# Patient Record
Sex: Female | Born: 2000 | Race: Asian | Hispanic: No | Marital: Single | Smoking: Never smoker
Health system: Southern US, Community
[De-identification: ages and names within clinical notes are randomized; demographics above are authoritative.]

## PROBLEM LIST (undated history)

## (undated) HISTORY — PX: TONSILLECTOMY: SUR1361

---
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
## 2019-02-20 ENCOUNTER — Ambulatory Visit
Admission: EM | Admit: 2019-02-20 | Discharge: 2019-02-20 | Disposition: A | Discharge status: Home / Self Care | Payer: Medicaid Other | Attending: Urgent Care | Admitting: Urgent Care

## 2019-02-20 ENCOUNTER — Other Ambulatory Visit: Payer: Self-pay

## 2019-02-20 DIAGNOSIS — H109 Unspecified conjunctivitis: Secondary | ICD-10-CM | POA: Diagnosis not present

## 2019-02-20 MED ORDER — TETRACAINE HCL 0.5 % OP SOLN: 1.0000 [drp] | Freq: Once | OPHTHALMIC | Status: DC

## 2019-02-20 MED ORDER — POLYMYXIN B-TRIMETHOPRIM 10000-0.1 UNIT/ML-% OP SOLN: 1.0000 [drp] | OPHTHALMIC | 0 refills | Status: DC

## 2019-02-20 MED ORDER — FLUORESCEIN SODIUM 1 MG OP STRP: 1.0000 | ORAL_STRIP | Freq: Once | OPHTHALMIC | Status: DC

## 2019-02-20 NOTE — ED Provider Notes (Signed)
[place], [place]   Name: Julie Simmons DOB: 05/17/2000 MRN: [id number] CSN: [id number] PCP: Patient, No Pcp Per  Arrival date and time:  02/20/19 1517  Chief Complaint:  Conjunctivitis   NOTE: Prior to seeing the patient today, I have reviewed the triage nursing documentation and vital signs. Clinical staff has updated patient's PMH/PSHx, current medication list, and drug allergies/intolerances to ensure comprehensive history available to assist in medical decision making.   History:   HPI: Julie Simmons is a 18 y.o. female who presents today with complaints of pain and redness in her RIGHT eye that began yesterday. She denies any injury to the eye, but notes that she was rubbing her eye and got mascara in it. Patient woke this morning with swelling and excessive tearing. She has experienced mild exudative crusting. Vision is not blurred. Patient notes that eye irritation is not exacerbated by light (photophobia). She does not wear contact lenses. Patient has not tried any over the counter eye drops or pain medications to help reduce/relieve her current symptoms.   Visual Acuity: Right Eye Distance: 20/30 Left Eye Distance: 20/30 Bilateral Distance: 20/30(uncorrected)  History reviewed. No pertinent past medical history.  Past Surgical History:  Procedure Laterality Date  . TONSILLECTOMY      History reviewed. No pertinent family history.  Social History   Tobacco Use  . Smoking status: Never Smoker  . Smokeless tobacco: Never Used  Substance Use Topics  . Alcohol use: Never    Frequency: Never  . Drug use: Never    There are no active problems to display for this patient.   Home Medications:    No outpatient medications have been marked as taking for the 02/20/19 encounter [facility] Encounter).    Allergies:   Patient has no known allergies.  Review of Systems (ROS): Review of Systems  Constitutional: Negative for chills and fever.  Eyes: Positive for pain,  discharge, redness and itching. Negative for photophobia and visual disturbance.  Respiratory: Negative for cough and shortness of breath.   Cardiovascular: Negative for chest pain and palpitations.  All other systems reviewed and are negative.    Vital Signs: Today's Vitals   02/20/19 1531 02/20/19 1532 02/20/19 1548  BP: 119/68    Pulse: 83    Resp: 16    Temp: 98.7 F (37.1 C)    TempSrc: Oral    SpO2: 100%    Weight:  191 lb (86.6 kg)   Height:  4\' 11"  (1.499 m)   PainSc: 0-No pain  0-No pain    Physical Exam: Physical Exam  Constitutional: She is oriented to person, place, and time and well-developed, well-nourished, and in no distress. Vital signs are normal.  HENT:  Head: Normocephalic and atraumatic.  Eyes: Right eye exhibits discharge. Right conjunctiva is injected.  Neck: Normal range of motion. Neck supple.  Cardiovascular: Normal rate and regular rhythm.  Pulmonary/Chest: Effort normal and breath sounds normal.  Musculoskeletal: Normal range of motion.  Neurological: She is alert and oriented to person, place, and time.  Skin: Skin is warm and dry. No rash noted.  Psychiatric: Mood and affect normal.   Wood's lamp examination of the RIGHT eye . Consent: Procedure explained to patient prior to performing. Drug allergies reviewed. Verbal consent obtained.  . Procedural course: Topical anesthetic (tetracaine) gtts instilled into the affected eye. Adequate time allowed for medication to be effective. Fluorescein stain applied. Eye thoroughly examined under black light.  . Findings: Examination revealed no increased fluorescein uptake in  the cornea. . Tolerance: Patient tolerated exam/procedure well  Urgent Care Treatments / Results:   LABS: PLEASE NOTE: all labs that were ordered this encounter are listed, however only abnormal results are displayed. Labs Reviewed - No data to display  EKG: -None  RADIOLOGY: No results found.  PROCEDURES: Procedures   MEDICATIONS RECEIVED THIS VISIT: Medications  tetracaine (PONTOCAINE) 0.5 % ophthalmic solution 1 drop (has no administration in time range)  fluorescein ophthalmic strip 1 strip (has no administration in time range)    PERTINENT CLINICAL COURSE NOTES/UPDATES:   Initial Impression / Assessment and Plan / Urgent Care Course:  Pertinent labs & imaging results that were available during my care of the patient were personally reviewed by me and considered in my medical decision making (see lab/imaging section of note for values and interpretations).  Julie Simmons is a 18 y.o. female who presents to [facility] today with complaints of Conjunctivitis   Patient is well appearing overall in clinic today. She does not appear to be in any acute distress. Presenting symptoms (see HPI) and exam as documented above. Wood's lamp examination revealed no evidence of corneal abrasion. Given appearance of eye, will proceed with treatment for acute bacterial conjunctivitis using a 5 day course of Polytrim ophthalmic gtts. Patient encouraged to avoid touching/rubbing her eye. Recommended cool compresses to help soothe eye. Patient to follow up with eye doctor in the next few days if not improving. May use Tylenol and/or Ibuprofen as needed for discomfort.  I have reviewed the follow up and strict return precautions for any new or worsening symptoms. Patient is aware of symptoms that would be deemed urgent/emergent, and would thus require further evaluation either here or in the emergency department. At the time of discharge, she verbalized understanding and consent with the discharge plan as it was reviewed with her. All questions were fielded by provider and/or clinic staff prior to patient discharge.    Final Clinical Impressions / Urgent Care Diagnoses:   Final diagnoses:  Bacterial conjunctivitis of right eye    New Prescriptions:   Controlled Substance Registry consulted? Not Applicable   Meds ordered this encounter  Medications  . tetracaine (PONTOCAINE) 0.5 % ophthalmic solution 1 drop  . fluorescein ophthalmic strip 1 strip  . trimethoprim-polymyxin b (POLYTRIM) ophthalmic solution    Sig: Place 1 drop into the right eye every 4 (four) hours. X 5 days    Dispense:  10 mL    Refill:  0    Recommended Follow up Care:  Patient encouraged to follow up with the following provider within the specified time frame, or sooner as dictated by the severity of her symptoms. As always, she was instructed that for any urgent/emergent care needs, she should seek care either here or in the emergency department for more immediate evaluation.  Follow-up Information    Your eye doctor In 1 week.   Why: General reassessment of symptoms if not improving. Sooner if your vision changes.        NOTE: This note was prepared using Scientist, clinical (histocompatibility and immunogenetics) along with smaller Lobbyist. Despite my best ability to proofread, there is the potential that transcriptional errors may still occur from this process, and are completely unintentional.    Verlee Monte, NP 02/20/19 1559

## 2019-02-20 NOTE — ED Triage Notes (Signed)
Patient states that on Saturday she rubbed her eye while she had mascara on. Patient states that right eye has been irritated since, redness, watery and sensitive to light.

## 2019-02-20 NOTE — Discharge Instructions (Signed)
It was very nice seeing you today in clinic. Thank you for entrusting me with your care.   Avoid rubbing /touching eye. Use cool compresses to help soothe.   Make arrangements to follow up with your regular eye doctor in the next week if not better, if if your vision worsens. If your symptoms/condition worsens, please seek follow up care either here or in the ER. Please remember, our [facility] providers are "right here with you" when you need [place].   Again, it was my pleasure to take care of you today. Thank you for choosing our clinic. I hope that you start to feel better quickly.   Honor Loh, MSN, APRN, FNP-C, CEN Advanced Practice Provider [facility]

## 2019-02-24 ENCOUNTER — Ambulatory Visit (INDEPENDENT_AMBULATORY_CARE_PROVIDER_SITE_OTHER): Payer: Medicaid Other | Admitting: Family Medicine

## 2019-02-24 ENCOUNTER — Encounter: Payer: Self-pay | Admitting: Family Medicine

## 2019-02-24 ENCOUNTER — Other Ambulatory Visit: Payer: Self-pay

## 2019-02-24 VITALS — BP 113/76 | HR 73 | Resp 16 | Ht 59.0 in | Wt 189.0 lb

## 2019-02-24 DIAGNOSIS — Z7689 Persons encountering health services in other specified circumstances: Secondary | ICD-10-CM | POA: Diagnosis not present

## 2019-02-24 DIAGNOSIS — T1591XD Foreign body on external eye, part unspecified, right eye, subsequent encounter: Secondary | ICD-10-CM | POA: Diagnosis not present

## 2019-02-24 DIAGNOSIS — H1031 Unspecified acute conjunctivitis, right eye: Secondary | ICD-10-CM

## 2019-02-24 NOTE — Progress Notes (Signed)
Date:  02/24/2019   Name:  Julie Simmons   DOB:  [birth year]   MRN:  [id number]   Chief Complaint: Establish Care  Patient is a 18 year old female who presents for a establishment of care exam. The patient reports the following problems: conjunctivitis. Health maintenance has been reviewed   Conjunctivitis  Episode onset: Sunday in urgent care/ 8/30 hit eye/scleral hemorrhage then 10/16/ mascara to eye. The onset was gradual. The problem occurs continuously. The problem has been unchanged. The problem is moderate. The symptoms are relieved by one or more prescription drugs (polymyxin B sulfate and trimetheprim). Exacerbated by: mascara. Associated symptoms include decreased vision, eye itching and eye redness. Pertinent negatives include no fever, no photophobia, no abdominal pain, no constipation, no diarrhea, no nausea, no vomiting, no congestion, no ear discharge, no ear pain, no headaches, no rhinorrhea, no sore throat, no stridor, no cough, no wheezing, no rash, no eye discharge and no eye pain.    Review of Systems  Constitutional: Negative.  Negative for chills, fatigue, fever and unexpected weight change.  HENT: Negative for congestion, ear discharge, ear pain, rhinorrhea, sinus pressure, sneezing and sore throat.   Eyes: Positive for redness and itching. Negative for photophobia, pain and discharge.  Respiratory: Negative for cough, shortness of breath, wheezing and stridor.   Gastrointestinal: Negative for abdominal pain, blood in stool, constipation, diarrhea, nausea and vomiting.  Endocrine: Negative for cold intolerance, heat intolerance, polydipsia, polyphagia and polyuria.  Genitourinary: Negative for dysuria, flank pain, frequency, hematuria, menstrual problem, pelvic pain, urgency, vaginal bleeding and vaginal discharge.  Musculoskeletal: Negative for arthralgias, back pain and myalgias.  Skin: Negative for rash.  Allergic/Immunologic: Negative for environmental allergies  and food allergies.  Neurological: Negative for dizziness, weakness, light-headedness, numbness and headaches.  Hematological: Negative for adenopathy. Does not bruise/bleed easily.  Psychiatric/Behavioral: Negative for dysphoric mood. The patient is not nervous/anxious.     There are no active problems to display for this patient.   No Known Allergies  Past Surgical History:  Procedure Laterality Date  . TONSILLECTOMY      Social History   Tobacco Use  . Smoking status: Never Smoker  . Smokeless tobacco: Never Used  Substance Use Topics  . Alcohol use: Never    Frequency: Never  . Drug use: Never     Medication list has been reviewed and updated.  Current Meds  Medication Sig  . trimethoprim-polymyxin b (POLYTRIM) ophthalmic solution Place 1 drop into the right eye every 4 (four) hours. X 5 days    No flowsheet data found.  BP Readings from Last 3 Encounters:  02/24/19 113/76  02/20/19 119/68    Physical Exam Vitals signs and nursing note reviewed.  Constitutional:      General: She is not in acute distress.    Appearance: She is not diaphoretic.  HENT:     Head: Normocephalic and atraumatic.     Right Ear: External ear normal.     Left Ear: External ear normal.     Nose: Nose normal.  Eyes:     General: Lids are everted, no foreign bodies appreciated.        Right eye: No discharge.        Left eye: No discharge.     Conjunctiva/sclera:     Right eye: Right conjunctiva is injected.     Left eye: Left conjunctiva is injected.     Pupils: Pupils are equal, round, and reactive to light.  Neck:     Musculoskeletal: Normal range of motion and neck supple.     Thyroid: No thyromegaly.     Vascular: No JVD.  Cardiovascular:     Rate and Rhythm: Normal rate and regular rhythm.     Heart sounds: Normal heart sounds. No murmur. No friction rub. No gallop.   Pulmonary:     Effort: Pulmonary effort is normal.     Breath sounds: Normal breath sounds.   Abdominal:     General: Bowel sounds are normal.     Palpations: Abdomen is soft. There is no mass.     Tenderness: There is no abdominal tenderness. There is no guarding.  Musculoskeletal: Normal range of motion.  Lymphadenopathy:     Cervical: No cervical adenopathy.  Skin:    General: Skin is warm and dry.  Neurological:     Mental Status: She is alert.     Deep Tendon Reflexes: Reflexes are normal and symmetric.     Wt Readings from Last 3 Encounters:  02/24/19 189 lb (85.7 kg)  02/20/19 191 lb (86.6 kg)    BP 113/76   Pulse 73   Resp 16   Ht 4\' 11"  (1.499 m)   Wt 189 lb (85.7 kg)   LMP 01/30/2019   SpO2 100%   BMI 38.17 kg/m   Assessment and Plan: 1. Establishing care with new doctor, encounter for Patient establishes care new with new physician has an acute concern that we are addressing today.  2. Acute bacterial conjunctivitis of right eye Patient had an unrelated injury to the right eye on August 30 about a week ago she got some mascara in the eye and the eye became red and inflamed.  Patient was seen in urgent care and was given polymyxin B with trimethoprim eyedrops.  There has not been any resolution on these drops and.  I suspect there may be a foreign body see below  3. Foreign body, eye, right, subsequent encounter Patient may have a foreign body that I cannot say eyelid was inverted and there was none noted conjunctiva was evaluated but would feel that there is still a foreign body possibility and over corneal abrasion.  Will refer to ophthalmology.

## 2019-03-27 ENCOUNTER — Ambulatory Visit
Admission: EM | Admit: 2019-03-27 | Discharge: 2019-03-27 | Disposition: A | Discharge status: Home / Self Care | Payer: Medicaid Other | Attending: Family Medicine | Admitting: Family Medicine

## 2019-03-27 ENCOUNTER — Other Ambulatory Visit: Payer: Self-pay

## 2019-03-27 ENCOUNTER — Ambulatory Visit: Payer: Medicaid Other

## 2019-03-27 ENCOUNTER — Encounter: Payer: Self-pay | Admitting: Emergency Medicine

## 2019-03-27 DIAGNOSIS — S39012A Strain of muscle, fascia and tendon of lower back, initial encounter: Secondary | ICD-10-CM

## 2019-03-27 DIAGNOSIS — M545 Low back pain: Secondary | ICD-10-CM | POA: Insufficient documentation

## 2019-03-27 DIAGNOSIS — Y92410 Unspecified street and highway as the place of occurrence of the external cause: Secondary | ICD-10-CM | POA: Diagnosis not present

## 2019-03-27 DIAGNOSIS — Y9389 Activity, other specified: Secondary | ICD-10-CM | POA: Diagnosis not present

## 2019-03-27 DIAGNOSIS — S161XXA Strain of muscle, fascia and tendon at neck level, initial encounter: Secondary | ICD-10-CM | POA: Diagnosis not present

## 2019-03-27 MED ORDER — HYDROCODONE-ACETAMINOPHEN 5-325 MG PO TABS: ORAL_TABLET | ORAL | 0 refills | Status: DC

## 2019-03-27 MED ORDER — CYCLOBENZAPRINE HCL 10 MG PO TABS: 10.0000 mg | ORAL_TABLET | Freq: Every day | ORAL | 0 refills | Status: DC

## 2019-03-27 NOTE — Discharge Instructions (Signed)
Rest, ice/heat, tylenol/advil as needed 
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
## 2019-06-13 DIAGNOSIS — R0981 Nasal congestion: Secondary | ICD-10-CM | POA: Diagnosis not present

## 2019-06-13 DIAGNOSIS — Z20822 Contact with and (suspected) exposure to covid-19: Secondary | ICD-10-CM | POA: Diagnosis not present

## 2019-09-21 ENCOUNTER — Ambulatory Visit: Payer: Medicaid Other | Admitting: Family Medicine

## 2019-09-25 ENCOUNTER — Ambulatory Visit: Payer: Medicaid Other | Admitting: Family Medicine

## 2019-09-26 ENCOUNTER — Ambulatory Visit: Payer: Medicaid Other | Admitting: Family Medicine

## 2019-10-23 ENCOUNTER — Encounter: Payer: Self-pay | Admitting: Family Medicine

## 2019-10-23 ENCOUNTER — Ambulatory Visit (INDEPENDENT_AMBULATORY_CARE_PROVIDER_SITE_OTHER): Payer: Medicaid Other | Admitting: Family Medicine

## 2019-10-23 ENCOUNTER — Other Ambulatory Visit: Payer: Self-pay

## 2019-10-23 VITALS — BP 120/64 | HR 76 | Ht 59.0 in | Wt 190.0 lb

## 2019-10-23 DIAGNOSIS — Z124 Encounter for screening for malignant neoplasm of cervix: Secondary | ICD-10-CM | POA: Diagnosis not present

## 2019-10-23 DIAGNOSIS — N915 Oligomenorrhea, unspecified: Secondary | ICD-10-CM

## 2019-10-23 NOTE — Progress Notes (Signed)
Date:  10/23/2019   Name:  Julie Simmons   DOB:  30-Nov-2000   MRN:  [id number]   Chief Complaint: referral gyn (labs for STDs )  Patient is a 19 year old female who presents for a referral to gynecology exam. The patient reports the following problems: ologomennorrhea and "general checkup". Health maintenance has been reviewed pap/screening.  Female GU Problem The patient's primary symptoms include missed menses and vaginal bleeding. The patient's pertinent negatives include no genital itching, genital lesions, genital odor, genital rash, pelvic pain or vaginal discharge. This is a recurrent (last normal 11/20.) problem. The current episode started more than 1 month ago. Episode frequency: only 2 periods since November. Pertinent negatives include no abdominal pain, anorexia, back pain, chills, constipation, diarrhea, dysuria, fever, flank pain, frequency, headaches, hematuria, nausea, rash, sore throat, urgency or vomiting. Vaginal discharge characteristics: amenorrhea. There has been no bleeding. She is sexually active (protected). It is unknown whether or not her partner has an STD. She uses condoms for contraception. Her menstrual history has been irregular. There is no history of a gynecological surgery.    No results found for: CREATININE, BUN, NA, K, CL, CO2 No results found for: CHOL, HDL, LDLCALC, LDLDIRECT, TRIG, CHOLHDL No results found for: TSH No results found for: HGBA1C No results found for: WBC, HGB, HCT, MCV, PLT No results found for: ALT, AST, GGT, ALKPHOS, BILITOT   Review of Systems  Constitutional: Negative.  Negative for chills, fatigue, fever and unexpected weight change.  HENT: Negative for congestion, ear discharge, ear pain, rhinorrhea, sinus pressure, sneezing and sore throat.   Eyes: Negative for photophobia, pain, discharge, redness and itching.  Respiratory: Negative for cough, shortness of breath, wheezing and stridor.   Gastrointestinal: Negative for  abdominal pain, anorexia, blood in stool, constipation, diarrhea, nausea and vomiting.  Endocrine: Negative for cold intolerance, heat intolerance, polydipsia, polyphagia and polyuria.  Genitourinary: Positive for missed menses. Negative for dysuria, flank pain, frequency, hematuria, menstrual problem, pelvic pain, urgency, vaginal bleeding, vaginal discharge and vaginal pain.  Musculoskeletal: Negative for arthralgias, back pain and myalgias.  Skin: Negative for rash.  Allergic/Immunologic: Negative for environmental allergies and food allergies.  Neurological: Negative for dizziness, weakness, light-headedness, numbness and headaches.  Hematological: Negative for adenopathy. Does not bruise/bleed easily.  Psychiatric/Behavioral: Negative for dysphoric mood. The patient is not nervous/anxious.     There are no problems to display for this patient.   No Known Allergies  Past Surgical History:  Procedure Laterality Date  . TONSILLECTOMY      Social History   Tobacco Use  . Smoking status: Never Smoker  . Smokeless tobacco: Never Used  Vaping Use  . Vaping Use: Never used  Substance Use Topics  . Alcohol use: Never  . Drug use: Never     Medication list has been reviewed and updated.  Current Meds  Medication Sig  . HYDROcodone-acetaminophen (NORCO/VICODIN) 5-325 MG tablet 1-2 tabs po bid prn    PHQ 2/9 Scores 10/23/2019  PHQ - 2 Score 4  PHQ- 9 Score 18    GAD 7 : Generalized Anxiety Score 10/23/2019  Nervous, Anxious, on Edge 3  Control/stop worrying 2  Worry too much - different things 2  Trouble relaxing 3  Restless 0  Easily annoyed or irritable 3  Afraid - awful might happen 3  Total GAD 7 Score 16  Anxiety Difficulty Very difficult    BP Readings from Last 3 Encounters:  10/23/19 120/64  03/27/19 105/66  02/24/19 113/76    Physical Exam Vitals and nursing note reviewed.  Constitutional:      General: She is not in acute distress.    Appearance:  She is not diaphoretic.  HENT:     Head: Normocephalic and atraumatic.     Right Ear: Tympanic membrane, ear canal and external ear normal.     Left Ear: Tympanic membrane, ear canal and external ear normal.     Nose: Nose normal.  Eyes:     General:        Right eye: No discharge.        Left eye: No discharge.     Conjunctiva/sclera: Conjunctivae normal.     Pupils: Pupils are equal, round, and reactive to light.  Neck:     Thyroid: No thyromegaly.     Vascular: No JVD.  Cardiovascular:     Rate and Rhythm: Normal rate and regular rhythm.     Heart sounds: Normal heart sounds. No murmur heard.  No friction rub. No gallop.   Pulmonary:     Effort: Pulmonary effort is normal.     Breath sounds: Normal breath sounds.  Abdominal:     General: Bowel sounds are normal.     Palpations: Abdomen is soft. There is no mass.     Tenderness: There is no abdominal tenderness. There is no guarding.  Musculoskeletal:        General: Normal range of motion.     Cervical back: Normal range of motion and neck supple.  Lymphadenopathy:     Cervical: No cervical adenopathy.  Skin:    General: Skin is warm and dry.  Neurological:     Mental Status: She is alert.     Deep Tendon Reflexes: Reflexes are normal and symmetric.     Wt Readings from Last 3 Encounters:  10/23/19 190 lb (86.2 kg)  03/27/19 185 lb (83.9 kg)  02/24/19 189 lb (85.7 kg)    BP 120/64   Pulse 76   Ht 4\' 11"  (1.499 m)   Wt 190 lb (86.2 kg)   BMI 38.38 kg/m   Assessment and Plan: 1. Oligomenorrhea, unspecified type New onset.  Patient's last.  That was normal in timing and quantity was in November of last year since then she went 4 months without a period and then had a heavy.  And now she has not had any further periods until a relatively minor 1 afterwards.  Patient has been doing pregnancy test even though she is states that she is using contraception in the form of condoms.  We will obtain a serum pregnancy test  for rule out pregnancy and refer to GYN for evaluation of oligomenorrhea and screening for cervical cancer. - hCG, serum, qualitative - Ambulatory referral to Gynecology  2. Cervical cancer screening Gust with patient and patient will be referred to GYN to discuss need for cervical cancer screening when she turns 19 next month. - Ambulatory referral to Gynecology

## 2019-10-24 LAB — HCG, SERUM, QUALITATIVE: hCG,Beta Subunit,Qual,Serum: NEGATIVE m[IU]/mL (ref <6)

## 2019-10-26 ENCOUNTER — Telehealth: Payer: Self-pay | Admitting: Obstetrics & Gynecology

## 2019-10-26 NOTE — Telephone Encounter (Signed)
[facility] referring for oligomenorrhea/ needs screening for cervical cancer. Phone on file not accepting calls at this time. Unable to reach patient to scheduled appointment

## 2019-11-09 ENCOUNTER — Encounter: Payer: Medicaid Other | Admitting: Advanced Practice Midwife

## 2019-11-16 ENCOUNTER — Encounter: Payer: Medicaid Other | Admitting: Obstetrics and Gynecology

## 2019-11-20 ENCOUNTER — Other Ambulatory Visit: Payer: Self-pay

## 2019-11-20 ENCOUNTER — Emergency Department
Admission: EM | Admit: 2019-11-20 | Discharge: 2019-11-20 | Disposition: A | Discharge status: Home / Self Care | Payer: Medicaid Other | Attending: Emergency Medicine | Admitting: Emergency Medicine

## 2019-11-20 DIAGNOSIS — R110 Nausea: Secondary | ICD-10-CM | POA: Insufficient documentation

## 2019-11-20 DIAGNOSIS — R1033 Periumbilical pain: Secondary | ICD-10-CM | POA: Diagnosis not present

## 2019-11-20 DIAGNOSIS — R109 Unspecified abdominal pain: Secondary | ICD-10-CM | POA: Diagnosis present

## 2019-11-20 LAB — URINALYSIS, COMPLETE (UACMP) WITH MICROSCOPIC
Bacteria, UA: NONE SEEN
Bilirubin Urine: NEGATIVE
Glucose, UA: NEGATIVE mg/dL
Ketones, ur: NEGATIVE mg/dL
Leukocytes,Ua: NEGATIVE
Nitrite: NEGATIVE
Protein, ur: NEGATIVE mg/dL
Specific Gravity, Urine: 1.021 (ref 1.005–1.030)
pH: 5 (ref 5.0–8.0)

## 2019-11-20 LAB — CBC
HCT: 42.6 % (ref 36.0–46.0)
Hemoglobin: 14.2 g/dL (ref 12.0–15.0)
MCH: 29.3 pg (ref 26.0–34.0)
MCHC: 33.3 g/dL (ref 30.0–36.0)
MCV: 87.8 fL (ref 80.0–100.0)
Platelets: 164 10*3/uL (ref 150–400)
RBC: 4.85 MIL/uL (ref 3.87–5.11)
RDW: 14.3 % (ref 11.5–15.5)
WBC: 8.8 10*3/uL (ref 4.0–10.5)
nRBC: 0 % (ref 0.0–0.2)

## 2019-11-20 LAB — COMPREHENSIVE METABOLIC PANEL
ALT: 16 U/L (ref 0–44)
AST: 20 U/L (ref 15–41)
Albumin: 4.3 g/dL (ref 3.5–5.0)
Alkaline Phosphatase: 49 U/L (ref 38–126)
Anion gap: 5 (ref 5–15)
BUN: 15 mg/dL (ref 6–20)
CO2: 27 mmol/L (ref 22–32)
Calcium: 9.3 mg/dL (ref 8.9–10.3)
Chloride: 106 mmol/L (ref 98–111)
Creatinine, Ser: 0.88 mg/dL (ref 0.44–1.00)
GFR calc Af Amer: >60 mL/min (ref >60)
GFR calc non Af Amer: >60 mL/min (ref >60)
Glucose, Bld: 99 mg/dL (ref 70–99)
Potassium: 3.6 mmol/L (ref 3.5–5.1)
Sodium: 138 mmol/L (ref 135–145)
Total Bilirubin: 0.5 mg/dL (ref 0.3–1.2)
Total Protein: 7.6 g/dL (ref 6.5–8.1)

## 2019-11-20 LAB — POCT PREGNANCY, URINE: Preg Test, Ur: NEGATIVE

## 2019-11-20 LAB — LIPASE, BLOOD: Lipase: 32 U/L (ref 11–51)

## 2019-11-20 MED ORDER — ONDANSETRON 4 MG PO TBDP
4.0000 mg | ORAL_TABLET | Freq: Once | ORAL | Status: AC
  Administered 2019-11-20: 4 mg via ORAL
  Filled 2019-11-20: qty 1

## 2019-11-20 MED ORDER — SODIUM CHLORIDE 0.9% FLUSH: 3.0000 mL | Freq: Once | INTRAVENOUS | Status: DC

## 2019-11-20 MED ORDER — DICYCLOMINE HCL 20 MG PO TABS
20.0000 mg | ORAL_TABLET | Freq: Once | ORAL | Status: AC
  Administered 2019-11-20: 20 mg via ORAL
  Filled 2019-11-20: qty 1

## 2019-11-20 MED ORDER — ONDANSETRON HCL 4 MG PO TABS: 4.0000 mg | ORAL_TABLET | Freq: Every day | ORAL | 1 refills | Status: DC | PRN

## 2019-11-20 MED ORDER — DICYCLOMINE HCL 20 MG PO TABS
20.0000 mg | ORAL_TABLET | Freq: Four times a day (QID) | ORAL | 0 refills | Status: DC
Start: 2019-11-20 — End: 2020-01-31

## 2019-11-20 NOTE — ED Provider Notes (Signed)
  ER Provider Note       Time seen: 12:00 PM    I have reviewed the vital signs and the nursing notes.  HISTORY   Chief Complaint Abdominal Pain    HPI Julie Simmons is a 19 y.o. female with a history of tonsillectomy who presents today for mid abdominal pain.  Patient states it started last night when she got up to go to work, has had some nausea.  Denies any urinary symptoms.  Discomfort is 4 out of 10.  History reviewed. No pertinent past medical history.  Past Surgical History:  Procedure Laterality Date  . TONSILLECTOMY      Allergies Patient has no known allergies.  Review of Systems Constitutional: Negative for fever. Cardiovascular: Negative for chest pain. Respiratory: Negative for shortness of breath. Gastrointestinal: Positive for abdominal pain, nausea Musculoskeletal: Negative for back pain. Skin: Negative for rash. Neurological: Negative for headaches, focal weakness or numbness.  All systems negative/normal/unremarkable except as stated in the HPI  ____________________________________________   PHYSICAL EXAM:  VITAL SIGNS: Vitals:   11/20/19 0826  BP: 118/74  Pulse: 93  Resp: 18  Temp: 98.1 F (36.7 C)  SpO2: 100%    Constitutional: Alert and oriented. Well appearing and in no distress. Eyes: Conjunctivae are normal. Normal extraocular movements. Cardiovascular: Normal rate, regular rhythm. No murmurs, rubs, or gallops. Respiratory: Normal respiratory effort without tachypnea nor retractions. Breath sounds are clear and equal bilaterally. No wheezes/rales/rhonchi. Gastrointestinal: Soft and nontender. Normal bowel sounds Musculoskeletal: Nontender with normal range of motion in extremities. No lower extremity tenderness nor edema. Neurologic:  Normal speech and language. No gross focal neurologic deficits are appreciated.  Skin:  Skin is warm, dry and intact. No rash noted. Psychiatric: Speech and behavior are normal.   ____________________________________________   LABS (pertinent positives/negatives)  Labs Reviewed  URINALYSIS, COMPLETE (UACMP) WITH MICROSCOPIC - Abnormal; Notable for the following components:      Result Value   Color, Urine YELLOW (*)    APPearance HAZY (*)    Hgb urine dipstick SMALL (*)    All other components within normal limits  LIPASE, BLOOD  COMPREHENSIVE METABOLIC PANEL  CBC  POCT PREGNANCY, URINE  POC URINE PREG, ED   DIFFERENTIAL DIAGNOSIS  Gastritis, gastroenteritis, dehydration, electrolyte abnormality, pregnancy  ASSESSMENT AND PLAN  Abdominal pain, nausea   Plan: The patient had presented for abdominal pain and nausea. Patient's labs are reassuring and her exam is benign.  She is cleared with encouragement to seek close outpatient follow-up as needed.  Daryel November MD    Note: This note was generated in part or whole with voice recognition software. Voice recognition is usually quite accurate but there are transcription errors that can and very often do occur. I apologize for any typographical errors that were not detected and corrected.     Emily Filbert, MD 11/20/19 1201

## 2019-11-20 NOTE — ED Triage Notes (Signed)
Pt comes via POV from home with c/o lower abdominal pain. Pt states this started last night when she got up to go to work. Pt  states some nausea.  Pt denies any chance of pregnancy. Pt denies any urinary symptoms

## 2019-11-28 DIAGNOSIS — Z20822 Contact with and (suspected) exposure to covid-19: Secondary | ICD-10-CM | POA: Diagnosis not present

## 2019-11-28 DIAGNOSIS — R110 Nausea: Secondary | ICD-10-CM | POA: Diagnosis not present

## 2019-12-07 ENCOUNTER — Ambulatory Visit: Payer: Medicaid Other | Admitting: Obstetrics and Gynecology

## 2020-01-29 DIAGNOSIS — K529 Noninfective gastroenteritis and colitis, unspecified: Secondary | ICD-10-CM | POA: Diagnosis not present

## 2020-01-31 ENCOUNTER — Other Ambulatory Visit (HOSPITAL_COMMUNITY)
Admission: RE | Admit: 2020-01-31 | Discharge: 2020-01-31 | Disposition: A | Discharge status: Home / Self Care | Payer: Medicaid Other | Source: Ambulatory Visit | Attending: Obstetrics and Gynecology | Admitting: Obstetrics and Gynecology

## 2020-01-31 ENCOUNTER — Ambulatory Visit (INDEPENDENT_AMBULATORY_CARE_PROVIDER_SITE_OTHER): Payer: Medicaid Other | Admitting: Obstetrics and Gynecology

## 2020-01-31 ENCOUNTER — Encounter: Payer: Self-pay | Admitting: Obstetrics and Gynecology

## 2020-01-31 ENCOUNTER — Other Ambulatory Visit: Payer: Self-pay

## 2020-01-31 VITALS — BP 117/81 | HR 59 | Ht 59.0 in | Wt 193.0 lb

## 2020-01-31 DIAGNOSIS — Z113 Encounter for screening for infections with a predominantly sexual mode of transmission: Secondary | ICD-10-CM | POA: Insufficient documentation

## 2020-01-31 DIAGNOSIS — N911 Secondary amenorrhea: Secondary | ICD-10-CM

## 2020-01-31 DIAGNOSIS — Z124 Encounter for screening for malignant neoplasm of cervix: Secondary | ICD-10-CM | POA: Insufficient documentation

## 2020-01-31 DIAGNOSIS — E669 Obesity, unspecified: Secondary | ICD-10-CM

## 2020-01-31 DIAGNOSIS — Z6838 Body mass index (BMI) 38.0-38.9, adult: Secondary | ICD-10-CM | POA: Diagnosis not present

## 2020-01-31 DIAGNOSIS — E66812 Obesity, class 2: Secondary | ICD-10-CM

## 2020-01-31 NOTE — Progress Notes (Signed)
Obstetrics & Gynecology Office Visit   Chief Complaint:  Chief Complaint  Patient presents with  . Gynecologic Exam    Pap smear referred by [facility]  . Metrorrhagia    History of Present Illness: 19 y.o. G0P0000 presenting for the evaluation of absent menstruation.  The patient report No LMP recorded..  Preceding the current episode of amenorrhea, the patient's menstrual cycles had been irregular.  The patient is currently sexually active and is not using hormonal contraception.  She does not have any contributing past medical history.  The patient has not started any new medications coinciding with the onset of her symptoms.  The patient is not interested in conceiving in the near future.  She does not exercise excessively.  Menarche age 65-11 (5ht grade) Lasting up to several weeks Normal flow No molimina symptoms   PCOS/Cushings Wt Change: fluctuating  Hirsutism: no Acne: no Balding: no Lipodystrophy:  no Acanthosis nigricans:  no Striae:  yes  Hyperprolactinemia Galactorrhea: no Headaches: no Vision Changes:  no Prior obstetric hemorrhage: not applicable  Thyroid Temperature Intolerance: yes Constipation or Diarrhea: yes, constipation Hair Thinning:  no Palpitations:  No Fatigue: no  Outlet Obstruction: Prior D&C: no Prior myomectomy: no Prior LEEP or CKC: no  Premature menopause: no, mother did have problems conceiving Fragile-X, autism: little sister has autism  Review of Systems: Review of Systems  Constitutional: Negative.   Gastrointestinal: Negative.   Genitourinary: Negative.   Endo/Heme/Allergies: Negative for polydipsia.     Past Medical History:  There are no problems to display for this patient.   Past Surgical History:  Past Surgical History:  Procedure Laterality Date  . TONSILLECTOMY      Gynecologic History: No LMP recorded.  Obstetric History: G0P0000  Family History:  Family History  Problem Relation Age of  Onset  . Healthy Mother   . Healthy Father     Social History:  Social History   Socioeconomic History  . Marital status: Single    Spouse name: Not on file  . Number of children: Not on file  . Years of education: Not on file  . Highest education level: Not on file  Occupational History  . Not on file  Tobacco Use  . Smoking status: Never Smoker  . Smokeless tobacco: Never Used  Vaping Use  . Vaping Use: Never used  Substance and Sexual Activity  . Alcohol use: Never    Comment: occ  . Drug use: Never  . Sexual activity: Yes    Birth control/protection: None  Other Topics Concern  . Not on file  Social History Narrative  . Not on file   Social Determinants of Health   Financial Resource Strain:   . Difficulty of Paying Living Expenses: Not on file  Food Insecurity:   . Worried About Programme researcher, broadcasting/film/video in the Last Year: Not on file  . Ran Out of Food in the Last Year: Not on file  Transportation Needs:   . Lack of Transportation (Medical): Not on file  . Lack of Transportation (Non-Medical): Not on file  Physical Activity:   . Days of Exercise per Week: Not on file  . Minutes of Exercise per Session: Not on file  Stress:   . Feeling of Stress : Not on file  Social Connections:   . Frequency of Communication with Friends and Family: Not on file  . Frequency of Social Gatherings with Friends and Family: Not on file  .  Attends Religious Services: Not on file  . Active Member of Clubs or Organizations: Not on file  . Attends Banker Meetings: Not on file  . Marital Status: Not on file  Intimate Partner Violence:   . Fear of Current or Ex-Partner: Not on file  . Emotionally Abused: Not on file  . Physically Abused: Not on file  . Sexually Abused: Not on file    Allergies:  No Known Allergies  Medications: Prior to Admission medications   Not on File    Physical Exam Blood pressure 117/81, pulse (!) 59, height 4\' 11"  (1.499 m), weight 193  lb (87.5 kg). No LMP recorded. Body mass index is 38.98 kg/m.  General: NAD HEENT: normocephalic, anicteric Pulmonary: No increased work of breathing Genitourinary:  External: Normal external female genitalia.  Normal urethral meatus, normal Bartholin's and Skene's glands.    Vagina: Normal vaginal mucosa, no evidence of prolapse.    Cervix: Grossly normal in appearance, no bleeding  Uterus: Non-enlarged, mobile, normal contour.  No CMT  Adnexa: ovaries non-enlarged, no adnexal masses  Rectal: deferred  Lymphatic: no evidence of inguinal lymphadenopathy Extremities: no edema, erythema, or tenderness Neurologic: Grossly intact Psychiatric: mood appropriate, affect full  Female chaperone present for pelvic portion of the physical exam  Assessment: 19 y.o. G0P0000 presenting with secondary amenorrhea  Plan: Problem List Items Addressed This Visit    None    Visit Diagnoses    Secondary amenorrhea    -  Primary   Relevant Orders   TSH+Prl+FSH+TestT+LH+DHEA S...   Hemoglobin A1c   36 Transvaginal Non-OB   Routine screening for STI (sexually transmitted infection)       Relevant Orders   Cytology - PAP   Screening for malignant neoplasm of cervix       Relevant Orders   Cytology - PAP   Class 2 obesity without serious comorbidity with body mass index (BMI) of 38.0 to 38.9 in adult, unspecified obesity type       Relevant Orders   TSH+Prl+FSH+TestT+LH+DHEA S...   Hemoglobin A1c      1) The risk long term risk of amenorrhea were discussed with the patient.  The role of unopposed estrogen in causing amenorrhea and the possible development of endometrial hyperplasia or carcinoma is discussed.  The risk of endometrial hyperplasia is linearly correlated with increasing BMI given the production of estrone by adipose tissue.   - most likely etiology based on symptoms constellation is PCOS - labs today - [place] in the next few weeks to see if meet [place] criteria and rule out other  underlying causes - was on OCP in past but noted worsening menses. Discussed 10 day course of provera and withdrawal bleed prior to starting OCP to set up for less bleeding side-effects  2) Although amenorrhea is the patients presenting complaint, we discussed that rather than simply addressing her symptom the goal of our work up would be aimed at identifying the underlying cause.  Disruption in the axis of any number of endocrine systems may evidence itself in the form of amenorrhea.  3) Ultrasound ordered for endometrial strip assessment and evaluation of ovaries  4) Return in about 2 weeks (around 02/14/2020) for 1-4 week TVUS and follow up.   02/16/2020, MD, Vena Austria [facility], [facility] 01/31/2020, 1:36 PM

## 2020-02-02 LAB — CYTOLOGY - PAP
Adequacy: ABSENT
Chlamydia: NEGATIVE
Comment: NEGATIVE
Comment: NORMAL
Diagnosis: NEGATIVE
Neisseria Gonorrhea: NEGATIVE

## 2020-02-05 LAB — HEMOGLOBIN A1C
Est. average glucose Bld gHb Est-mCnc: 111 mg/dL
Hgb A1c MFr Bld: 5.5 % (ref 4.8–5.6)

## 2020-02-05 LAB — TSH+PRL+FSH+TESTT+LH+DHEA S...
17-Hydroxyprogesterone: 82 ng/dL
Androstenedione: 188 ng/dL (ref 41–262)
DHEA-SO4: 123 ug/dL (ref 110.0–431.7)
FSH: 8.2 m[IU]/mL
LH: 15.1 m[IU]/mL
Prolactin: 18.5 ng/mL (ref 4.8–23.3)
TSH: 4.59 u[IU]/mL — ABNORMAL HIGH (ref 0.450–4.500)
Testosterone, Free: 1.5 pg/mL (ref 0.0–4.2)
Testosterone: 46 ng/dL (ref 13–71)

## 2020-02-16 ENCOUNTER — Encounter: Payer: Self-pay | Admitting: Obstetrics and Gynecology

## 2020-02-16 ENCOUNTER — Ambulatory Visit (INDEPENDENT_AMBULATORY_CARE_PROVIDER_SITE_OTHER): Payer: Medicaid Other | Admitting: Obstetrics and Gynecology

## 2020-02-16 ENCOUNTER — Other Ambulatory Visit: Payer: Self-pay

## 2020-02-16 ENCOUNTER — Ambulatory Visit (INDEPENDENT_AMBULATORY_CARE_PROVIDER_SITE_OTHER): Payer: Medicaid Other

## 2020-02-16 VITALS — BP 114/70 | Ht 59.0 in | Wt 193.4 lb

## 2020-02-16 DIAGNOSIS — E282 Polycystic ovarian syndrome: Secondary | ICD-10-CM

## 2020-02-16 DIAGNOSIS — R7989 Other specified abnormal findings of blood chemistry: Secondary | ICD-10-CM

## 2020-02-16 DIAGNOSIS — N911 Secondary amenorrhea: Secondary | ICD-10-CM

## 2020-02-16 NOTE — Progress Notes (Signed)
Gynecology Ultrasound Follow Up  Chief Complaint:  Chief Complaint  Patient presents with  . Gynecologic Exam     History of Present Illness: Patient is a 19 y.o. female who presents today for ultrasound evaluation of secondary amenorrhea.  Ultrasound demonstrates the following findgins Adnexa: no masses, multiple antral follicels noted in both ovaries Uterus: Non-enlarged, no fibroids, with normal endometrial stripe  Additional: no free fluid  Review of Systems: Review of Systems  Constitutional: Negative.   Gastrointestinal: Negative.   Genitourinary: Negative.   Skin: Negative.     Past Medical History:  No past medical history on file.  Past Surgical History:  Past Surgical History:  Procedure Laterality Date  . TONSILLECTOMY      Gynecologic History:  Patient's last menstrual period was 12/24/2019.   Family History:  Family History  Problem Relation Age of Onset  . Healthy Mother   . Healthy Father     Social History:  Social History   Socioeconomic History  . Marital status: Single    Spouse name: Not on file  . Number of children: Not on file  . Years of education: Not on file  . Highest education level: Not on file  Occupational History  . Not on file  Tobacco Use  . Smoking status: Never Smoker  . Smokeless tobacco: Never Used  Vaping Use  . Vaping Use: Never used  Substance and Sexual Activity  . Alcohol use: Never    Comment: occ  . Drug use: Never  . Sexual activity: Yes    Birth control/protection: None  Other Topics Concern  . Not on file  Social History Narrative  . Not on file   Social Determinants of Health   Financial Resource Strain:   . Difficulty of Paying Living Expenses: Not on file  Food Insecurity:   . Worried About Programme researcher, broadcasting/film/video in the Last Year: Not on file  . Ran Out of Food in the Last Year: Not on file  Transportation Needs:   . Lack of Transportation (Medical): Not on file  . Lack of Transportation  (Non-Medical): Not on file  Physical Activity:   . Days of Exercise per Week: Not on file  . Minutes of Exercise per Session: Not on file  Stress:   . Feeling of Stress : Not on file  Social Connections:   . Frequency of Communication with Friends and Family: Not on file  . Frequency of Social Gatherings with Friends and Family: Not on file  . Attends Religious Services: Not on file  . Active Member of Clubs or Organizations: Not on file  . Attends Banker Meetings: Not on file  . Marital Status: Not on file  Intimate Partner Violence:   . Fear of Current or Ex-Partner: Not on file  . Emotionally Abused: Not on file  . Physically Abused: Not on file  . Sexually Abused: Not on file    Allergies:  No Known Allergies  Medications: Prior to Admission medications   Not on File    Physical Exam Vitals: Blood pressure 114/70, height 4\' 11"  (1.499 m), weight 193 lb 6.4 oz (87.7 kg), last menstrual period 12/24/2019.  General: NAD HEENT: normocephalic, anicteric Pulmonary: No increased work of breathing Extremities: no edema, erythema, or tenderness Neurologic: Grossly intact, normal gait Psychiatric: mood appropriate, affect full   Assessment: 19 y.o. G0P0000  follow up secondary amenorrhea  Plan: Problem List Items Addressed This Visit    None  Visit Diagnoses    Elevated TSH    -  Primary   Relevant Orders   Thyroid Panel With TSH      1) PCOS - based on antral follicle count as well as menstrual pattern.  LH/FSH ratio also consistent with PCOS.  Laboratory evaluation also showed slight increased TSH.  We discussed management options in the setting of patient not being interested in conceiving fr another 2-3 year.  Patient leary in trying OCP and chooses expectant management. Also dicussed option for cylical provera or IUD.  We discussed that PCOS may regress, although fertility aiding procedures are available and an option should she decided to proceed  with trying to conceive in the future  2) A total of 15 minutes were spent in face-to-face contact with the patient during this encounter with over half of that time devoted to counseling and coordination of care.  3) Return in about 1 year (around 02/15/2021) for annual.    Vena Austria, MD, [facility], [facility] 02/16/2020, 11:30 AM

## 2020-02-16 NOTE — Progress Notes (Signed)
PT here for a U/S and follow up visit.

## 2020-02-17 LAB — THYROID PANEL WITH TSH
Free Thyroxine Index: 1.2 (ref 1.2–4.9)
T3 Uptake Ratio: 26 % (ref 24–39)
T4, Total: 4.6 ug/dL (ref 4.5–12.0)
TSH: 0.897 u[IU]/mL (ref 0.450–4.500)

## 2020-03-11 DIAGNOSIS — M791 Myalgia, unspecified site: Secondary | ICD-10-CM | POA: Diagnosis not present

## 2020-03-11 DIAGNOSIS — J069 Acute upper respiratory infection, unspecified: Secondary | ICD-10-CM | POA: Diagnosis not present

## 2020-03-11 DIAGNOSIS — Z20822 Contact with and (suspected) exposure to covid-19: Secondary | ICD-10-CM | POA: Diagnosis not present

## 2020-03-18 DIAGNOSIS — M791 Myalgia, unspecified site: Secondary | ICD-10-CM | POA: Diagnosis not present

## 2020-03-18 DIAGNOSIS — Z20822 Contact with and (suspected) exposure to covid-19: Secondary | ICD-10-CM | POA: Diagnosis not present

## 2020-03-18 DIAGNOSIS — B349 Viral infection, unspecified: Secondary | ICD-10-CM | POA: Diagnosis not present

## 2020-03-18 DIAGNOSIS — R112 Nausea with vomiting, unspecified: Secondary | ICD-10-CM | POA: Diagnosis not present

## 2020-03-24 DIAGNOSIS — R070 Pain in throat: Secondary | ICD-10-CM | POA: Diagnosis not present

## 2020-03-24 DIAGNOSIS — J069 Acute upper respiratory infection, unspecified: Secondary | ICD-10-CM | POA: Diagnosis not present
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
## 2020-10-01 DIAGNOSIS — H5213 Myopia, bilateral: Secondary | ICD-10-CM | POA: Diagnosis not present

## 2020-10-23 DIAGNOSIS — H5213 Myopia, bilateral: Secondary | ICD-10-CM | POA: Diagnosis not present

## 2020-12-12 DIAGNOSIS — Z20822 Contact with and (suspected) exposure to covid-19: Secondary | ICD-10-CM | POA: Diagnosis not present

## 2021-01-06 ENCOUNTER — Other Ambulatory Visit: Payer: Self-pay

## 2021-01-06 ENCOUNTER — Emergency Department
Admission: EM | Admit: 2021-01-06 | Discharge: 2021-01-06 | Disposition: A | Discharge status: Home / Self Care | Payer: Medicaid Other | Attending: Emergency Medicine | Admitting: Emergency Medicine

## 2021-01-06 DIAGNOSIS — R109 Unspecified abdominal pain: Secondary | ICD-10-CM | POA: Diagnosis present

## 2021-01-06 DIAGNOSIS — R1084 Generalized abdominal pain: Secondary | ICD-10-CM | POA: Diagnosis not present

## 2021-01-06 DIAGNOSIS — F410 Panic disorder [episodic paroxysmal anxiety] without agoraphobia: Secondary | ICD-10-CM | POA: Diagnosis not present

## 2021-01-06 LAB — URINALYSIS, COMPLETE (UACMP) WITH MICROSCOPIC
Bilirubin Urine: NEGATIVE
Glucose, UA: NEGATIVE mg/dL
Ketones, ur: 15 mg/dL — AB
Leukocytes,Ua: NEGATIVE
Nitrite: NEGATIVE
Protein, ur: 30 mg/dL — AB
Specific Gravity, Urine: 1.02 (ref 1.005–1.030)
pH: 7 (ref 5.0–8.0)

## 2021-01-06 LAB — COMPREHENSIVE METABOLIC PANEL
ALT: 13 U/L (ref 0–44)
AST: 21 U/L (ref 15–41)
Albumin: 4.2 g/dL (ref 3.5–5.0)
Alkaline Phosphatase: 51 U/L (ref 38–126)
Anion gap: 8 (ref 5–15)
BUN: 17 mg/dL (ref 6–20)
CO2: 26 mmol/L (ref 22–32)
Calcium: 9.1 mg/dL (ref 8.9–10.3)
Chloride: 104 mmol/L (ref 98–111)
Creatinine, Ser: 0.91 mg/dL (ref 0.44–1.00)
GFR, Estimated: >60 mL/min (ref >60)
Glucose, Bld: 167 mg/dL — ABNORMAL HIGH (ref 70–99)
Potassium: 3 mmol/L — ABNORMAL LOW (ref 3.5–5.1)
Sodium: 138 mmol/L (ref 135–145)
Total Bilirubin: 0.7 mg/dL (ref 0.3–1.2)
Total Protein: 7.5 g/dL (ref 6.5–8.1)

## 2021-01-06 LAB — POC URINE PREG, ED: Preg Test, Ur: NEGATIVE

## 2021-01-06 LAB — CBC WITH DIFFERENTIAL/PLATELET
Abs Immature Granulocytes: 0.03 10*3/uL (ref 0.00–0.07)
Basophils Absolute: 0 10*3/uL (ref 0.0–0.1)
Basophils Relative: 0 %
Eosinophils Absolute: 0 10*3/uL (ref 0.0–0.5)
Eosinophils Relative: 0 %
HCT: 44.6 % (ref 36.0–46.0)
Hemoglobin: 14.5 g/dL (ref 12.0–15.0)
Immature Granulocytes: 0 %
Lymphocytes Relative: 57 %
Lymphs Abs: 5.3 10*3/uL — ABNORMAL HIGH (ref 0.7–4.0)
MCH: 28.9 pg (ref 26.0–34.0)
MCHC: 32.5 g/dL (ref 30.0–36.0)
MCV: 89 fL (ref 80.0–100.0)
Monocytes Absolute: 0.4 10*3/uL (ref 0.1–1.0)
Monocytes Relative: 4 %
Neutro Abs: 3.7 10*3/uL (ref 1.7–7.7)
Neutrophils Relative %: 39 %
Platelets: 151 10*3/uL (ref 150–400)
RBC: 5.01 MIL/uL (ref 3.87–5.11)
RDW: 14.8 % (ref 11.5–15.5)
WBC: 9.4 10*3/uL (ref 4.0–10.5)
nRBC: 0 % (ref 0.0–0.2)

## 2021-01-06 LAB — HCG, QUANTITATIVE, PREGNANCY: hCG, Beta Chain, Quant, S: <1 m[IU]/mL (ref <5)

## 2021-01-06 LAB — LIPASE, BLOOD: Lipase: 34 U/L (ref 11–51)
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
## 2021-01-06 NOTE — ED Triage Notes (Signed)
Pt to ED for possible panic attack per family. Pt reports lower abd pain, dizziness and feeling like she could not breathe earlier. Pt alert and oriented. 100% on RA. Tearful . NAD noted

## 2021-01-06 NOTE — ED Provider Notes (Signed)
[facility] Emergency Department Provider Note   ____________________________________________   Event Date/Time   First MD Initiated Contact with Patient 01/06/21 [phone]     (approximate)  I have reviewed the triage vital signs and the nursing notes.   HISTORY  Chief Complaint Abdominal Pain    HPI Julie Simmons is a 20 y.o. female who comes in initially saying that she had woken up with severe abdominal pain and never had anything like this before and did not feel like her previous panic attacks.  Later on when her mother came she said that she had had a bad argument with her boyfriend and was fine now and felt that it probably was a panic attack.        History reviewed. No pertinent past medical history.  Patient Active Problem List   Diagnosis Date Noted   PCOS (polycystic ovarian syndrome) 02/16/2020    Past Surgical History:  Procedure Laterality Date   TONSILLECTOMY      Prior to Admission medications   Not on File    Allergies Patient has no known allergies.  Family History  Problem Relation Age of Onset   Healthy Mother    Healthy Father     Social History Social History   Tobacco Use   Smoking status: Never   Smokeless tobacco: Never  Vaping Use   Vaping Use: Never used  Substance Use Topics   Alcohol use: Yes    Comment: occ   Drug use: Yes    Types: Marijuana    Review of Systems Currently Constitutional: No fever/chills Eyes: No visual changes. ENT: No sore throat. Cardiovascular: Denies chest pain. Respiratory: Denies shortness of breath. Gastrointestinal: No abdominal pain.  No nausea, no vomiting.  No diarrhea.  No constipation. Genitourinary: Negative for dysuria. Musculoskeletal: Negative for back pain. Skin: Negative for rash. Neurological: Negative for headaches, focal weakness   ____________________________________________   PHYSICAL EXAM:  VITAL SIGNS: ED Triage Vitals  Enc Vitals  Group     BP 01/06/21 0838 (!) 138/106     Pulse Rate 01/06/21 0830 (!) 54     Resp 01/06/21 0830 16     Temp 01/06/21 0838 (!) 97.5 F (36.4 C)     Temp Source 01/06/21 0838 Oral     SpO2 01/06/21 0830 100 %     Weight 01/06/21 0835 185 lb (83.9 kg)     Height 01/06/21 0835 5\' 6"  (1.676 m)     Head Circumference --      Peak Flow --      Pain Score --      Pain Loc --      Pain Edu? --      Excl. in GC? --    Currently Constitutional: Alert and oriented. Well appearing and in no acute distress. Eyes: Conjunctivae are normal. PER Head: Atraumatic. Nose: No congestion/rhinnorhea. Mouth/Throat: Mucous membranes are moist.  Oropharynx non-erythematous. Neck: No stridor.   Cardiovascular: Normal rate, regular rhythm. Grossly normal heart sounds.  Good peripheral circulation. Respiratory: Normal respiratory effort.  No retractions. Lungs CTAB. Gastrointestinal: Soft and nontender. No distention. No abdominal bruits.  Musculoskeletal: No lower extremity tenderness nor edema.  Neurologic:  Normal speech and language. No gross focal neurologic deficits are appreciated.  Skin:  Skin is warm, dry and intact. No rash noted.   ____________________________________________   LABS (all labs ordered are listed, but only abnormal results are displayed)  Labs Reviewed  COMPREHENSIVE METABOLIC PANEL - Abnormal; Notable  for the following components:      Result Value   Potassium 3.0 (*)    Glucose, Bld 167 (*)    All other components within normal limits  CBC WITH DIFFERENTIAL/PLATELET - Abnormal; Notable for the following components:   Lymphs Abs 5.3 (*)    All other components within normal limits  LIPASE, BLOOD  HCG, QUANTITATIVE, PREGNANCY  URINALYSIS, COMPLETE (UACMP) WITH MICROSCOPIC  POC URINE PREG, ED   ____________________________________________  EKG   ____________________________________________  RADIOLOGY Jill Poling, personally viewed and evaluated these  images (plain radiographs) as part of my medical decision making, as well as reviewing the written report by the radiologist.  ED MD interpretation:    Official radiology report(s): No results found.  ____________________________________________   PROCEDURES  Procedure(s) performed (including Critical Care):  Procedures   ____________________________________________   INITIAL IMPRESSION / ASSESSMENT AND PLAN / ED COURSE  Patient's labs are normal pregnancy test is negative this likely was a panic attack.  Patient feels fine now we will let her go she can return at any time if she needs to.              ____________________________________________   FINAL CLINICAL IMPRESSION(S) / ED DIAGNOSES  Final diagnoses:  Generalized abdominal pain  Panic attack     ED Discharge Orders     None        Note:  This document was prepared using Dragon voice recognition software and may include unintentional dictation errors.    Arnaldo Natal, MD 01/06/21 1027

## 2021-01-06 NOTE — Discharge Instructions (Addendum)
Please return for any further problems.  Follow-up with your regular doctor.

## 2021-01-06 NOTE — ED Notes (Signed)
Pt reports after waking up this am she started having lower abd pain that radiates to the right. Pt has a hx of pcos and is unsure of her lmp. Pt reports that she vomited once, pt's mom is at bedside and thought initially the patient was having a panic attack but states that she had never felt like this before with other attacks.

## 2021-02-09 DIAGNOSIS — Z3202 Encounter for pregnancy test, result negative: Secondary | ICD-10-CM | POA: Diagnosis not present

## 2021-02-09 DIAGNOSIS — H6123 Impacted cerumen, bilateral: Secondary | ICD-10-CM | POA: Diagnosis not present

## 2021-02-09 DIAGNOSIS — R232 Flushing: Secondary | ICD-10-CM | POA: Diagnosis not present

## 2021-05-29 ENCOUNTER — Ambulatory Visit: Payer: Medicaid Other | Admitting: Family Medicine

## 2021-06-02 ENCOUNTER — Ambulatory Visit (INDEPENDENT_AMBULATORY_CARE_PROVIDER_SITE_OTHER): Payer: Medicaid Other | Admitting: Family Medicine

## 2021-06-02 ENCOUNTER — Other Ambulatory Visit: Payer: Self-pay

## 2021-06-02 ENCOUNTER — Encounter: Payer: Self-pay | Admitting: Family Medicine

## 2021-06-02 VITALS — BP 130/80 | HR 68 | Ht 59.0 in | Wt 175.0 lb

## 2021-06-02 DIAGNOSIS — F419 Anxiety disorder, unspecified: Secondary | ICD-10-CM | POA: Diagnosis not present

## 2021-06-02 DIAGNOSIS — F32A Depression, unspecified: Secondary | ICD-10-CM | POA: Diagnosis not present
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
## 2021-06-14 ENCOUNTER — Emergency Department
Admission: EM | Admit: 2021-06-14 | Discharge: 2021-06-14 | Disposition: A | Discharge status: Home / Self Care | Payer: Medicaid Other | Attending: Emergency Medicine | Admitting: Emergency Medicine

## 2021-06-14 DIAGNOSIS — U071 COVID-19: Secondary | ICD-10-CM | POA: Diagnosis not present

## 2021-06-14 DIAGNOSIS — R059 Cough, unspecified: Secondary | ICD-10-CM | POA: Diagnosis present

## 2021-06-14 LAB — RESP PANEL BY RT-PCR (FLU A&B, COVID) ARPGX2
Influenza A by PCR: NEGATIVE
Influenza B by PCR: NEGATIVE
SARS Coronavirus 2 by RT PCR: POSITIVE — AB

## 2021-06-14 LAB — GROUP A STREP BY PCR: Group A Strep by PCR: NOT DETECTED

## 2021-06-14 MED ORDER — ACETAMINOPHEN 325 MG PO TABS
650.0000 mg | ORAL_TABLET | Freq: Once | ORAL | Status: AC | PRN
  Administered 2021-06-14: 650 mg via ORAL
  Filled 2021-06-14: qty 2

## 2021-06-14 NOTE — ED Notes (Signed)
Patient states she was exposed to [place] patient yesterday. Patient has sore throat, body aches and cough. Patient has been explained the importance of self isolating and taking Tylenol and Motrin.

## 2021-06-14 NOTE — ED Triage Notes (Signed)
Pt took nyquil at 1300 today and then woke up feeling bad. Pt has a cough and sore throat.

## 2021-06-14 NOTE — ED Notes (Signed)
Discharge instructions provided to patient. Patient verbalized understanding of all. Patient ambulated with a steady gait.

## 2021-06-14 NOTE — ED Notes (Signed)
Provider at bedside to speak with patient.

## 2021-06-15 NOTE — ED Provider Notes (Signed)
[facility] Provider Note  Patient Contact: 12:10 AM (approximate)   History   Nasal Congestion   HPI  Julie Simmons is a 21 y.o. female presents to the emergency department with cough, nasal congestion and body aches for the past 2 to 3 days.  No chest pain, chest tightness or abdominal pain.  Patient denies possibility of pregnancy.      Physical Exam   Triage Vital Signs: ED Triage Vitals  Enc Vitals Group     BP 06/14/21 2050 114/73     Pulse Rate 06/14/21 2050 (!) 104     Resp 06/14/21 2050 18     Temp 06/14/21 2050 100.2 F (37.9 C)     Temp Source 06/14/21 2050 Oral     SpO2 06/14/21 2050 97 %     Weight 06/14/21 2049 178 lb (80.7 kg)     Height 06/14/21 2049 4\' 11"  (1.499 m)     Head Circumference --      Peak Flow --      Pain Score 06/14/21 2057 6     Pain Loc --      Pain Edu? --      Excl. in GC? --     Most recent vital signs: Vitals:   06/14/21 2050 06/14/21 2245  BP: 114/73 118/75  Pulse: (!) 104 98  Resp: 18 18  Temp: 100.2 F (37.9 C) 98.9 F (37.2 C)  SpO2: 97% 99%     Constitutional: Alert and oriented. Patient is lying supine. Eyes: Conjunctivae are normal. PERRL. EOMI. Head: Atraumatic. ENT:      Ears: Tympanic membranes are mildly injected with mild effusion bilaterally.       Nose: No congestion/rhinnorhea.      Mouth/Throat: Mucous membranes are moist. Posterior pharynx is mildly erythematous.  Hematological/Lymphatic/Immunilogical: No cervical lymphadenopathy.  Cardiovascular: Normal rate, regular rhythm. Normal S1 and S2.  Good peripheral circulation. Respiratory: Normal respiratory effort without tachypnea or retractions. Lungs CTAB. Good air entry to the bases with no decreased or absent breath sounds. Gastrointestinal: Bowel sounds 4 quadrants. Soft and nontender to palpation. No guarding or rigidity. No palpable masses. No distention. No CVA tenderness. Musculoskeletal: Full range of motion to  all extremities. No gross deformities appreciated. Neurologic:  Normal speech and language. No gross focal neurologic deficits are appreciated.  Skin:  Skin is warm, dry and intact. No rash noted. Psychiatric: Mood and affect are normal. Speech and behavior are normal. Patient exhibits appropriate insight and judgement.   ED Results / Procedures / Treatments   Labs (all labs ordered are listed, but only abnormal results are displayed) Labs Reviewed  RESP PANEL BY RT-PCR (FLU A&B, COVID) ARPGX2 - Abnormal; Notable for the following components:      Result Value   SARS Coronavirus 2 by RT PCR POSITIVE (*)    All other components within normal limits  GROUP A STREP BY PCR          PROCEDURES:  Critical Care performed: No  Procedures   MEDICATIONS ORDERED IN ED: Medications  acetaminophen (TYLENOL) tablet 650 mg (650 mg Oral Given 06/14/21 2100)     IMPRESSION / MDM / ASSESSMENT AND PLAN / ED COURSE  I reviewed the triage vital signs and the nursing notes.                              Differential diagnosis includes, but is not limited  to, COVID-19, influenza, group A strep...  Assessment and Plan: Covid 70:  21 year old female presents to the emergency department with cold-like symptoms.  She tested positive for COVID-19.  Supportive measures were encouraged at home.  Return precautions were given to return with new or worsening symptoms.      FINAL CLINICAL IMPRESSION(S) / ED DIAGNOSES   Final diagnoses:  COVID-19     Rx / DC Orders   ED Discharge Orders     None        Note:  This document was prepared using Dragon voice recognition software and may include unintentional dictation errors.   Julie Simmons [place], PA-C 06/15/21 0011    Julie Antis, MD 06/15/21 2204

## 2021-06-16 ENCOUNTER — Telehealth: Payer: Self-pay

## 2021-06-16 NOTE — Telephone Encounter (Signed)
Transition Care Management Follow-up Telephone Call Date of discharge and from where: 06/14/2021-[facility] How have you been since you were released from the hospital? Pt stated she is still not feeling well but will follow up with PCP virtually tomorrow  Any questions or concerns? No  Items Reviewed: Did the pt receive and understand the discharge instructions provided? Yes  Medications obtained and verified?  No medications given at discharge  Other? No  Any new allergies since your discharge? No  Dietary orders reviewed? No Do you have support at home? Yes   Home Care and Equipment/Supplies: Were home health services ordered? not applicable If so, what is the name of the agency? N/A  Has the agency set up a time to come to the patient's home? not applicable Were any new equipment or medical supplies ordered?  No What is the name of the medical supply agency? N/A Were you able to get the supplies/equipment? not applicable Do you have any questions related to the use of the equipment or supplies? No  Functional Questionnaire: (I = Independent and D = Dependent) ADLs: I  Bathing/Dressing- I  Meal Prep- I  Eating- I  Maintaining continence- I  Transferring/Ambulation- I  Managing Meds- I  Follow up appointments reviewed:  PCP Hospital f/u appt confirmed? No   Specialist Hospital f/u appt confirmed? No   Are transportation arrangements needed? No  If their condition worsens, is the pt aware to call PCP or go to the Emergency Dept.? Yes Was the patient provided with contact information for the PCP's office or ED? Yes Was to pt encouraged to call back with questions or concerns? Yes

## 2021-06-19 DIAGNOSIS — F4312 Post-traumatic stress disorder, chronic: Secondary | ICD-10-CM | POA: Diagnosis not present

## 2021-06-19 DIAGNOSIS — Z1389 Encounter for screening for other disorder: Secondary | ICD-10-CM | POA: Diagnosis not present

## 2021-06-19 DIAGNOSIS — Z79899 Other long term (current) drug therapy: Secondary | ICD-10-CM | POA: Diagnosis not present

## 2021-11-13 ENCOUNTER — Ambulatory Visit
Admission: EM | Admit: 2021-11-13 | Discharge: 2021-11-13 | Disposition: A | Discharge status: Home / Self Care | Payer: Medicaid Other | Attending: Emergency Medicine | Admitting: Emergency Medicine

## 2021-11-13 DIAGNOSIS — R1032 Left lower quadrant pain: Secondary | ICD-10-CM | POA: Insufficient documentation

## 2021-11-13 LAB — URINALYSIS, MICROSCOPIC (REFLEX)

## 2021-11-13 LAB — URINALYSIS, ROUTINE W REFLEX MICROSCOPIC
Bilirubin Urine: NEGATIVE
Glucose, UA: NEGATIVE mg/dL
Ketones, ur: NEGATIVE mg/dL
Leukocytes,Ua: NEGATIVE
Nitrite: NEGATIVE
Protein, ur: NEGATIVE mg/dL
Specific Gravity, Urine: 1.025 (ref 1.005–1.030)
pH: 5.5 (ref 5.0–8.0)

## 2021-11-13 MED ORDER — DICYCLOMINE HCL 20 MG PO TABS: 20.0000 mg | ORAL_TABLET | Freq: Every day | ORAL | 0 refills | Status: AC

## 2021-11-13 MED ORDER — FAMOTIDINE 20 MG PO TABS
20.0000 mg | ORAL_TABLET | Freq: Two times a day (BID) | ORAL | 0 refills | Status: AC
Start: 2021-11-13 — End: ?

## 2021-11-13 NOTE — ED Provider Notes (Signed)
[facility]    CSN: [id number] Arrival date & time: 11/13/21  0807      History   Chief Complaint Chief Complaint  Patient presents with   Abdominal Pain    HPI Julie Simmons is a 21 y.o. female.   Patient presents with intermittent centralized abdominal pain occurring for 3 days.  Pain typically lasting approximately 10 minutes before resolution, rating it 8 out of 10, described as sharp and shooting.  Symptoms are worsened by movement, can be felt with coughing.  Denies pain with deep breathing.  Associated abdominal bloating and sour belching.  Had nausea 1 day ago but has resolved, endorses a loose stool this morning.  Prior to you have been having regular bowel movements.  Decreased appetite but tolerating fluids.  No known sick contacts.  Denies vomiting, fever, chills, urinary symptoms, vaginal symptoms.  History of PCOS.  Has all organs.    History reviewed. No pertinent past medical history.  Patient Active Problem List   Diagnosis Date Noted   PCOS (polycystic ovarian syndrome) 02/16/2020    Past Surgical History:  Procedure Laterality Date   TONSILLECTOMY      OB History     Gravida  0   Para  0   Term  0   Preterm  0   AB  0   Living  0      SAB  0   IAB  0   Ectopic  0   Multiple  0   Live Births  0            Home Medications    Prior to Admission medications   Not on File    Family History Family History  Problem Relation Age of Onset   Healthy Mother    Healthy Father     Social History Social History   Tobacco Use   Smoking status: Never   Smokeless tobacco: Never  Vaping Use   Vaping Use: Every day  Substance Use Topics   Alcohol use: Yes    Comment: occ   Drug use: Yes    Types: Marijuana     Allergies   Patient has no known allergies.   Review of Systems Review of Systems  Constitutional: Negative.   Respiratory: Negative.    Cardiovascular: Negative.   Gastrointestinal:  Positive  for abdominal distention, abdominal pain and nausea. Negative for anal bleeding, blood in stool, constipation, diarrhea, rectal pain and vomiting.  Skin: Negative.      Physical Exam Triage Vital Signs ED Triage Vitals  Enc Vitals Group     BP 11/13/21 0835 114/77     Pulse Rate 11/13/21 0835 (!) 58     Resp 11/13/21 0835 18     Temp 11/13/21 0835 98.5 F (36.9 C)     Temp Source 11/13/21 0835 Oral     SpO2 11/13/21 0835 100 %     Weight 11/13/21 0833 158 lb (71.7 kg)     Height 11/13/21 0833 4\' 11"  (1.499 m)     Head Circumference --      Peak Flow --      Pain Score 11/13/21 0829 8     Pain Loc --      Pain Edu? --      Excl. in GC? --    No data found.  Updated Vital Signs BP 114/77 (BP Location: Left Arm)   Pulse (!) 58   Temp 98.5 F (36.9 C) (Oral)  Resp 18   Ht 4\' 11"  (1.499 m)   Wt 158 lb (71.7 kg)   LMP 10/16/2021   SpO2 100%   BMI 31.91 kg/m   Visual Acuity Right Eye Distance:   Left Eye Distance:   Bilateral Distance:    Right Eye Near:   Left Eye Near:    Bilateral Near:     Physical Exam Constitutional:      Appearance: She is well-developed.  HENT:     Head: Normocephalic.  Pulmonary:     Effort: Pulmonary effort is normal.  Abdominal:     General: Abdomen is flat. Bowel sounds are normal.     Palpations: Abdomen is soft.     Tenderness: There is abdominal tenderness in the left lower quadrant.  Skin:    General: Skin is warm and dry.  Neurological:     General: No focal deficit present.     Mental Status: She is alert and oriented to person, place, and time.  Psychiatric:        Mood and Affect: Mood normal.        Behavior: Behavior normal.      UC Treatments / Results  Labs (all labs ordered are listed, but only abnormal results are displayed) Labs Reviewed  URINALYSIS, ROUTINE W REFLEX MICROSCOPIC    EKG   Radiology No results found.  Procedures Procedures (including critical care time)  Medications Ordered in  UC Medications - No data to display  Initial Impression / Assessment and Plan / UC Course  I have reviewed the triage vital signs and the nursing notes.  Pertinent labs & imaging results that were available during my care of the patient were reviewed by me and considered in my medical decision making (see chart for details).  Left lower quadrant abdominal pain  Vital signs are stable and patient is in no signs of distress, mild tenderness is noted to the left lower quadrant however notation of symptoms is not consistent with any particular diagnosis, discussed with patient, low suspicion for an acute abdomen at this time and we will move forward with management of symptoms, prescribe dicyclomine as patient has had success with this medicine in the past when abdominal pain was present and prescribed famotidine in addition, may continue use of Motrin and warm compresses for management of discomfort, recommended increase fluid intake until appetite returns and a bland diet to prevent further stomach irritation, may follow-up with his urgent care as needed if symptoms continue to persist, work note given Final Clinical Impressions(s) / UC Diagnoses   Final diagnoses:  None   Discharge Instructions   None    ED Prescriptions   None    PDMP not reviewed this encounter.   10/18/2021, Valinda Hoar 11/13/21 [phone]

## 2021-11-13 NOTE — ED Triage Notes (Signed)
Pt c/o stomach pain x3days.  Pt states that the pain is behind the navel and lasts for about 10 min before going away.   Pt states that she has been having gas and "sour burps". Pt has been having loose stools  and believes she was constipated.   Pt denies any vaginal odor, discharge, urinary odor, or frequency.   Pt denies any heavy lifting or different activity.

## 2021-11-13 NOTE — Discharge Instructions (Signed)
At this time the cause of your abdominal pain is unknown however I do not believe that there is more serious organ involvement and we will move forward in an attempt to reduce your discomfort  Take dicyclomine every morning for the next 7 days, this medicine is to help with pain, you may continue use of Motrin in addition  You may hold warm compresses over the affected area in 10 to 15-minute intervals for comfort  In use of famotidine every morning and every evening for 14 days, this medicine is to help reduce the stomach acid which will help calm your bloating and sour belching  While symptoms are present attempt to eat a blander diet, avoid spicy, greasy or heavier meals as this may cause irritation to your stomach  Please increase your fluid intake through use of water and electrolyte replacement substances such as Gatorade to prevent dehydration and to your appetite returns  If your symptoms continue to persist while using the medicine you may follow-up with urgent care as needed for reevaluation

## 2022-08-03 ENCOUNTER — Encounter: Payer: Self-pay | Admitting: Emergency Medicine

## 2022-08-03 ENCOUNTER — Ambulatory Visit
Admission: EM | Admit: 2022-08-03 | Discharge: 2022-08-03 | Disposition: A | Discharge status: Home / Self Care | Payer: Medicaid Other | Attending: Family Medicine | Admitting: Family Medicine

## 2022-08-03 DIAGNOSIS — J069 Acute upper respiratory infection, unspecified: Secondary | ICD-10-CM

## 2022-08-03 MED ORDER — IPRATROPIUM BROMIDE 0.06 % NA SOLN: 2.0000 | Freq: Four times a day (QID) | NASAL | 12 refills | Status: AC

## 2022-08-03 MED ORDER — PROMETHAZINE-DM 6.25-15 MG/5ML PO SYRP: 5.0000 mL | ORAL_SOLUTION | Freq: Four times a day (QID) | ORAL | 0 refills | Status: AC | PRN

## 2022-08-03 NOTE — ED Provider Notes (Signed)
[facility]    CSN: [id number] Arrival date & time: 08/03/22  1202      History   Chief Complaint Chief Complaint  Patient presents with   Headache   Cough   Nasal Congestion    HPI Julie Simmons is a 22 y.o. female.   HPI   Julie Simmons presents for chest pain, headache, sinus pressure, body aches, coughing, nasal congestion, rhinorrhea, chills and tactile fever. Symptoms started Friday morning. Motrin around 3 AM. She was tossing and turning for hours. No known sick contacts. She vapes without nictotine. No history of asthma.      History reviewed. No pertinent past medical history.  Patient Active Problem List   Diagnosis Date Noted   PCOS (polycystic ovarian syndrome) 02/16/2020    Past Surgical History:  Procedure Laterality Date   TONSILLECTOMY      OB History     Gravida  0   Para  0   Term  0   Preterm  0   AB  0   Living  0      SAB  0   IAB  0   Ectopic  0   Multiple  0   Live Births  0            Home Medications    Prior to Admission medications   Medication Sig Start Date End Date Taking? Authorizing Provider  ipratropium (ATROVENT) 0.06 % nasal spray Place 2 sprays into both nostrils 4 (four) times daily. 08/03/22  Yes Tamisha Nordstrom, DO  promethazine-dextromethorphan (PROMETHAZINE-DM) 6.25-15 MG/5ML syrup Take 5 mLs by mouth 4 (four) times daily as needed. 08/03/22  Yes Bernice Mullin, DO  dicyclomine (BENTYL) 20 MG tablet Take 1 tablet (20 mg total) by mouth daily. 11/13/21   White, Leitha Schuller, NP  famotidine (PEPCID) 20 MG tablet Take 1 tablet (20 mg total) by mouth 2 (two) times daily. 11/13/21   Hans Eden, NP    Family History Family History  Problem Relation Age of Onset   Healthy Mother    Healthy Father     Social History Social History   Tobacco Use   Smoking status: Never   Smokeless tobacco: Never  Vaping Use   Vaping Use: Every day  Substance Use Topics   Alcohol use: Yes     Comment: occ   Drug use: Yes    Types: Marijuana     Allergies   Patient has no known allergies.   Review of Systems Review of Systems: negative unless otherwise stated in HPI.      Physical Exam Triage Vital Signs ED Triage Vitals  Enc Vitals Group     BP 08/03/22 1358 115/72     Pulse Rate 08/03/22 1358 63     Resp 08/03/22 1358 18     Temp 08/03/22 1358 99.5 F (37.5 C)     Temp Source 08/03/22 1358 Oral     SpO2 08/03/22 1358 100 %     Weight 08/03/22 1401 168 lb (76.2 kg)     Height 08/03/22 1401 5' (1.524 m)     Head Circumference --      Peak Flow --      Pain Score 08/03/22 1400 6     Pain Loc --      Pain Edu? --      Excl. in [place]? --    No data found.  Updated Vital Signs BP 115/72   Pulse 63   Temp  99.5 F (37.5 C) (Oral)   Resp 18   Ht 5' (1.524 m)   Wt 76.2 kg   LMP 07/20/2022 (Approximate)   SpO2 100%   BMI 32.81 kg/m   Visual Acuity Right Eye Distance:   Left Eye Distance:   Bilateral Distance:    Right Eye Near:   Left Eye Near:    Bilateral Near:     Physical Exam GEN:     alert, ill non-toxic appearing female in no distress    HENT:  mucus membranes moist, oropharyngeal without lesions or exudate, no tonsillar hypertrophy, mild oropharyngeal erythema, moderate erythematous edematous turbinates, clear nasal discharge, bilateral TM normal EYES:   pupils equal and reactive, no scleral injection or discharge NECK:  normal ROM, anterior tender lymphadenopathy, no meningismus   RESP:  no increased work of breathing, clear to auscultation bilaterally CVS:   regular rate and rhythm Skin:   warm and dry, no rash on visible skin    UC Treatments / Results  Labs (all labs ordered are listed, but only abnormal results are displayed) Labs Reviewed - No data to display  EKG   Radiology No results found.  Procedures Procedures (including critical care time)  Medications Ordered in UC Medications - No data to display  Initial  Impression / Assessment and Plan / UC Course  I have reviewed the triage vital signs and the nursing notes.  Pertinent labs & imaging results that were available during my care of the patient were reviewed by me and considered in my medical decision making (see chart for details).       Pt is a 22 y.o. female who presents for 3 days of respiratory symptoms. [name] is afebrile here without recent antipyretics. Satting well on room air. Overall pt is ill but non-toxic appearing, well hydrated, without respiratory distress. Pulmonary exam is unremarkable.  COVID testing declined. History consistent with viral respiratory illness. Discussed symptomatic treatment.  Explained lack of efficacy of antibiotics in viral disease.  Typical duration of symptoms discussed.  Atrovent nasal spray for nasal congestion and Promethazine DM for cough.  Work note provided at patient's request.  Return and ED precautions given and voiced understanding. Discussed MDM, treatment plan and plan for follow-up with patient who agrees with plan.     Final Clinical Impressions(s) / UC Diagnoses   Final diagnoses:  Viral URI with cough     Discharge Instructions      If your were prescribed medication, stop by the pharmacy to pick them up.   You can take Tylenol and/or Ibuprofen as needed for fever reduction and pain relief.    For cough: honey 1/2 to 1 teaspoon (you can dilute the honey in water or another fluid).  You can also use guaifenesin and dextromethorphan for cough. You can use a humidifier for chest congestion and cough.  If you don't have a humidifier, you can sit in the bathroom with the hot shower running.      For sore throat: try warm salt water gargles, Mucinex sore throat cough drops or cepacol lozenges, throat spray, warm tea or water with lemon/honey, popsicles or ice, or OTC cold relief medicine for throat discomfort. You can also purchase chloraseptic spray at the pharmacy or dollar store.    For congestion: take a daily anti-histamine like Zyrtec, Claritin, and a oral decongestant, such as pseudoephedrine.  You can also use Flonase 1-2 sprays in each nostril daily. Afrin is also a good option, if you do  not have high blood pressure.    It is important to stay hydrated: drink plenty of fluids (water, gatorade/powerade/pedialyte, juices, or teas) to keep your throat moisturized and help further relieve irritation/discomfort.    Return or go to the Emergency Department if symptoms worsen or do not improve in the next few days      ED Prescriptions     Medication Sig Dispense Auth. Provider   ipratropium (ATROVENT) 0.06 % nasal spray Place 2 sprays into both nostrils 4 (four) times daily. 15 mL Yoniel Arkwright, DO   promethazine-dextromethorphan (PROMETHAZINE-DM) 6.25-15 MG/5ML syrup Take 5 mLs by mouth 4 (four) times daily as needed. 118 mL Lyndee Hensen, DO      PDMP not reviewed this encounter.   Lyndee Hensen, DO 08/03/22 1420

## 2022-08-03 NOTE — ED Triage Notes (Signed)
Pt has had sinus congestion, cough, frontal headache, body aches, for 2 days.  Cough produces clear sputum and clear when blowing nose. Took motrin at 3 AM.  + chills, has not checked temp.  Tearful during triage r/t pain.  Pt c/o chest pain from coughing so much.

## 2022-08-03 NOTE — Discharge Instructions (Signed)
If your were prescribed medication, stop by the pharmacy to pick them up.   You can take Tylenol and/or Ibuprofen as needed for fever reduction and pain relief.    For cough: honey 1/2 to 1 teaspoon (you can dilute the honey in water or another fluid).  You can also use guaifenesin and dextromethorphan for cough. You can use a humidifier for chest congestion and cough.  If you don't have a humidifier, you can sit in the bathroom with the hot shower running.      For sore throat: try warm salt water gargles, Mucinex sore throat cough drops or cepacol lozenges, throat spray, warm tea or water with lemon/honey, popsicles or ice, or OTC cold relief medicine for throat discomfort. You can also purchase chloraseptic spray at the pharmacy or dollar store.   For congestion: take a daily anti-histamine like Zyrtec, Claritin, and a oral decongestant, such as pseudoephedrine.  You can also use Flonase 1-2 sprays in each nostril daily. Afrin is also a good option, if you do not have high blood pressure.    It is important to stay hydrated: drink plenty of fluids (water, gatorade/powerade/pedialyte, juices, or teas) to keep your throat moisturized and help further relieve irritation/discomfort.    Return or go to the Emergency Department if symptoms worsen or do not improve in the next few days   

## 2022-11-10 ENCOUNTER — Ambulatory Visit: Payer: Medicaid Other | Admitting: Family Medicine

## 2022-11-17 ENCOUNTER — Encounter: Payer: Self-pay | Admitting: Family Medicine
